# Patient Record
Sex: Male | Born: 1995 | Race: White | Hispanic: Yes | Marital: Single | Smoking: Never smoker
Health system: Southern US, Community
[De-identification: ages and names within clinical notes are randomized; demographics above are authoritative.]

## PROBLEM LIST (undated history)

## (undated) DIAGNOSIS — R519 Headache, unspecified: Secondary | ICD-10-CM

## (undated) DIAGNOSIS — R51 Headache: Secondary | ICD-10-CM

## (undated) HISTORY — DX: Headache, unspecified: R51.9

## (undated) HISTORY — DX: Headache: R51

---
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
## 2016-03-08 ENCOUNTER — Telehealth: Payer: Self-pay | Admitting: Family Medicine

## 2016-03-08 NOTE — Telephone Encounter (Signed)
Pt request refill lisdexamfetamine (VYVANSE) 40 MG capsule °3 mo supply °

## 2016-03-09 NOTE — Telephone Encounter (Signed)
Refills OK. 

## 2016-03-09 NOTE — Telephone Encounter (Signed)
Last refill 11-27-2015 #30 x3 Last OV 09-29-2015 Please advise

## 2016-03-11 MED ORDER — LISDEXAMFETAMINE DIMESYLATE 40 MG PO CAPS
40.0000 mg | ORAL_CAPSULE | ORAL | 0 refills | Status: DC
Start: 2016-03-11 — End: 2016-07-22

## 2016-03-11 MED ORDER — LISDEXAMFETAMINE DIMESYLATE 40 MG PO CAPS: ORAL_CAPSULE | ORAL | 0 refills | Status: DC

## 2016-03-11 NOTE — Telephone Encounter (Signed)
Pts mother is aware via voicemail that scripts are ready for pick up.

## 2016-07-22 ENCOUNTER — Telehealth: Payer: Self-pay | Admitting: Family Medicine

## 2016-07-22 MED ORDER — LISDEXAMFETAMINE DIMESYLATE 40 MG PO CAPS: ORAL_CAPSULE | ORAL | 0 refills | Status: DC

## 2016-07-22 MED ORDER — LISDEXAMFETAMINE DIMESYLATE 40 MG PO CAPS: 40.0000 mg | ORAL_CAPSULE | ORAL | 0 refills | Status: DC

## 2016-07-22 NOTE — Telephone Encounter (Signed)
Last refill 03/11/16. Last office visit 09/29/15.  Okay to fill?

## 2016-07-22 NOTE — Telephone Encounter (Signed)
Pt needs new rx vyvanse 40 mg #30 each for next 3 months

## 2016-07-22 NOTE — Telephone Encounter (Signed)
Mom is aware Rx ready for pick up

## 2016-07-22 NOTE — Telephone Encounter (Signed)
Refills OK. 

## 2016-11-15 DIAGNOSIS — S61412A Laceration without foreign body of left hand, initial encounter: Secondary | ICD-10-CM | POA: Diagnosis not present

## 2016-11-15 DIAGNOSIS — W25XXXA Contact with sharp glass, initial encounter: Secondary | ICD-10-CM | POA: Diagnosis not present

## 2016-12-01 DIAGNOSIS — F4320 Adjustment disorder, unspecified: Secondary | ICD-10-CM | POA: Diagnosis not present

## 2017-01-17 ENCOUNTER — Ambulatory Visit (INDEPENDENT_AMBULATORY_CARE_PROVIDER_SITE_OTHER): Payer: BLUE CROSS/BLUE SHIELD | Admitting: Family Medicine

## 2017-01-17 ENCOUNTER — Encounter: Payer: Self-pay | Admitting: Family Medicine

## 2017-01-17 VITALS — BP 102/78 | HR 100 | Temp 98.5°F | Ht 68.5 in | Wt 178.2 lb

## 2017-01-17 DIAGNOSIS — Z0000 Encounter for general adult medical examination without abnormal findings: Secondary | ICD-10-CM

## 2017-01-17 DIAGNOSIS — Z23 Encounter for immunization: Secondary | ICD-10-CM | POA: Diagnosis not present

## 2017-01-17 MED ORDER — LISDEXAMFETAMINE DIMESYLATE 40 MG PO CAPS: ORAL_CAPSULE | ORAL | 0 refills | Status: DC

## 2017-01-17 MED ORDER — LISDEXAMFETAMINE DIMESYLATE 40 MG PO CAPS: 40.0000 mg | ORAL_CAPSULE | ORAL | 0 refills | Status: DC

## 2017-01-17 NOTE — Patient Instructions (Signed)

## 2017-01-17 NOTE — Progress Notes (Signed)
Subjective:     Patient ID: Jon Lloyd, male   DOB: September 04, 1995, 21 y.o.   MRN: [id number]  HPI Patient seen for physical exam. [facility] [employer]. He has history of attention deficit disorder and takes Vyvanse 40 mg daily. Frequently does not take this on weekends. Seems to be working well. He's had some chronic insomnia for quite some time. Does frequently watch TV at night. No alcohol use. Avoids caffeine. Patient thinks he had tetanus booster within the past couple years. We do not have full vaccine record  Past Medical History:  Diagnosis Date  . Frequent headaches    Past Surgical History:  Procedure Laterality Date  . APPENDECTOMY  2012    reports that he has never smoked. He has never used smokeless tobacco. He reports that he does not drink alcohol or use drugs. family history includes Arthritis in his father. No Known Allergies   Review of Systems  Constitutional: Negative for activity change, appetite change, fatigue and fever.  HENT: Negative for congestion, ear pain and trouble swallowing.   Eyes: Negative for pain and visual disturbance.  Respiratory: Negative for cough, shortness of breath and wheezing.   Cardiovascular: Negative for chest pain and palpitations.  Gastrointestinal: Negative for abdominal distention, abdominal pain, blood in stool, constipation, diarrhea, nausea, rectal pain and vomiting.  Genitourinary: Negative for dysuria, hematuria and testicular pain.  Musculoskeletal: Negative for arthralgias and joint swelling.  Skin: Negative for rash.  Neurological: Negative for dizziness, syncope and headaches.  Hematological: Negative for adenopathy.  Psychiatric/Behavioral: Positive for sleep disturbance. Negative for confusion and dysphoric mood.       Objective:   Physical Exam  Constitutional: He is oriented to person, place, and time. He appears well-developed and well-nourished. No distress.  HENT:  Head:  Normocephalic and atraumatic.  Right Ear: External ear normal.  Left Ear: External ear normal.  Mouth/Throat: Oropharynx is clear and moist.  Eyes: Pupils are equal, round, and reactive to light. Conjunctivae and EOM are normal.  Neck: Normal range of motion. Neck supple. No thyromegaly present.  Cardiovascular: Normal rate, regular rhythm and normal heart sounds.   No murmur heard. Pulmonary/Chest: No respiratory distress. He has no wheezes. He has no rales.  Abdominal: Soft. Bowel sounds are normal. He exhibits no distension and no mass. There is no tenderness. There is no rebound and no guarding.  Musculoskeletal: He exhibits no edema.  Lymphadenopathy:    He has no cervical adenopathy.  Neurological: He is alert and oriented to person, place, and time. He displays normal reflexes. No cranial nerve deficit.  Skin: No rash noted.  Psychiatric: He has a normal mood and affect.       Assessment:     Physical exam. He has some chronic intermittent insomnia    Plan:     -Flu vaccine given. -Confirm date of last tetanus. -He's had previous meningitis vaccine and reportedly also had booster -Nonpharmacologic management of insomnia discussed. Consider over-the-counter melatonin. Avoid bright lights within one hour bedtime. Handout given.  Kristian CoveyBruce W Jannine Abreu MD [facility]

## 2017-02-17 DIAGNOSIS — F4320 Adjustment disorder, unspecified: Secondary | ICD-10-CM | POA: Diagnosis not present

## 2017-04-26 ENCOUNTER — Telehealth: Payer: Self-pay | Admitting: Family Medicine

## 2017-04-26 NOTE — Telephone Encounter (Signed)
Copied from CRM [id number]. Topic: Quick Communication - Rx Refill/Question >> Apr 26, 2017  8:29 AM Louie BunPalacios Medina, Rosey Batheresa D wrote: Has the patient contacted their pharmacy? Yes (Agent: If no, request that the patient contact the pharmacy for the refill.) Preferred Pharmacy (with phone number or street name): [id number] [place], [place] - [address] Agent: Please be advised that RX refills may take up to 3 business days. We ask that you follow-up with your pharmacy. Patient needs refill on his lisdexamfetamine (VYVANSE) 40 MG capsule. Please call patient when its ready for pick-up, thanks.

## 2017-04-26 NOTE — Telephone Encounter (Signed)
Medication needs approval. Chart up to date.Thanks.

## 2017-04-26 NOTE — Telephone Encounter (Signed)
Last refill was given at office visit 01/17/17.  Okay to fill?

## 2017-04-26 NOTE — Telephone Encounter (Signed)
Refills OK. 

## 2017-04-28 ENCOUNTER — Encounter: Payer: Self-pay | Admitting: *Deleted

## 2017-04-28 MED ORDER — LISDEXAMFETAMINE DIMESYLATE 40 MG PO CAPS
40.0000 mg | ORAL_CAPSULE | ORAL | 0 refills | Status: DC
Start: 2017-04-28 — End: 2018-01-10

## 2017-04-28 MED ORDER — LISDEXAMFETAMINE DIMESYLATE 40 MG PO CAPS: ORAL_CAPSULE | ORAL | 0 refills | Status: DC

## 2017-04-28 MED ORDER — LISDEXAMFETAMINE DIMESYLATE 40 MG PO CAPS
ORAL_CAPSULE | ORAL | 0 refills | Status: DC
Start: 2017-04-28 — End: 2018-01-10

## 2017-04-28 NOTE — Telephone Encounter (Signed)
Attempted to call patient but unable to leave a message.  Rx ready for pick up.

## 2017-07-10 NOTE — Telephone Encounter (Signed)
The three scripts dated 04/28/17 that was in the file cabinet was destroyed by SomaliaSylvia and myself 07/10/17.

## 2017-10-16 IMAGING — DX DG HAND COMPLETE 3+V*R*
3 series · 3 of 3 positions shown · non-contrast
Comparison: None.

CLINICAL DATA: Pain following injury 8 days prior

EXAM:
RIGHT HAND - COMPLETE 3+ VIEW

[hand ap]
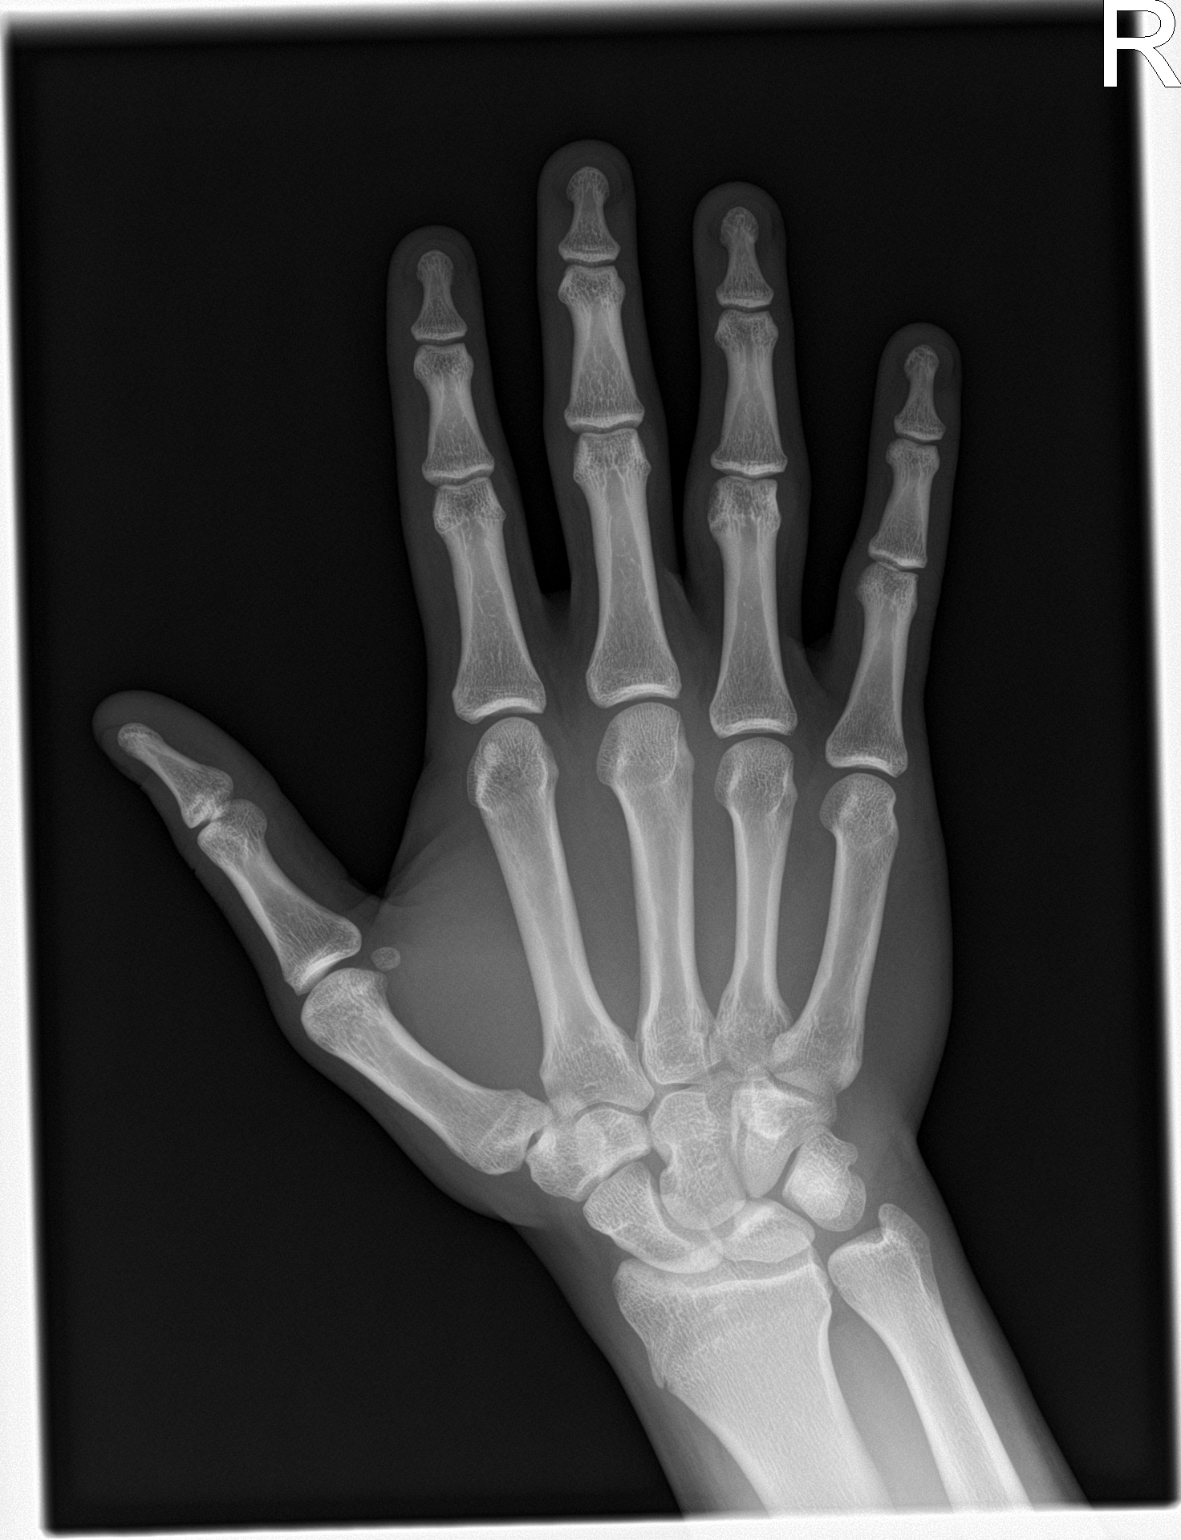

[hand obl]
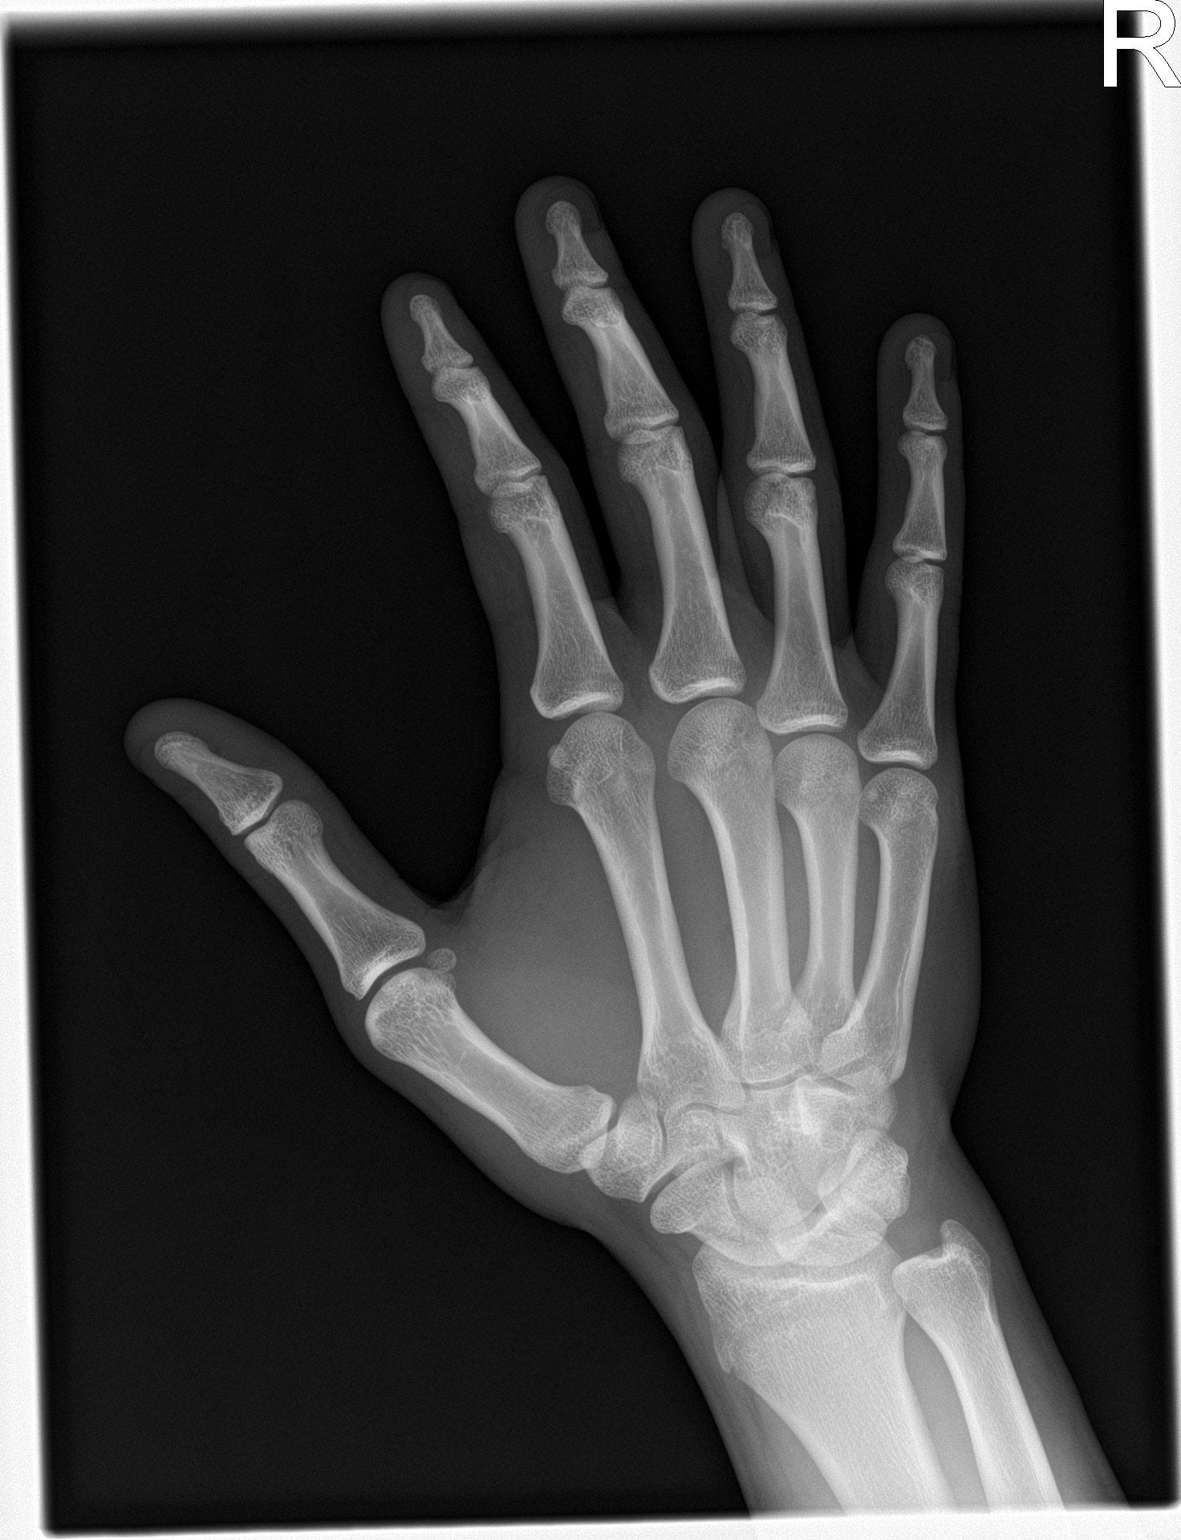

[hand lat]
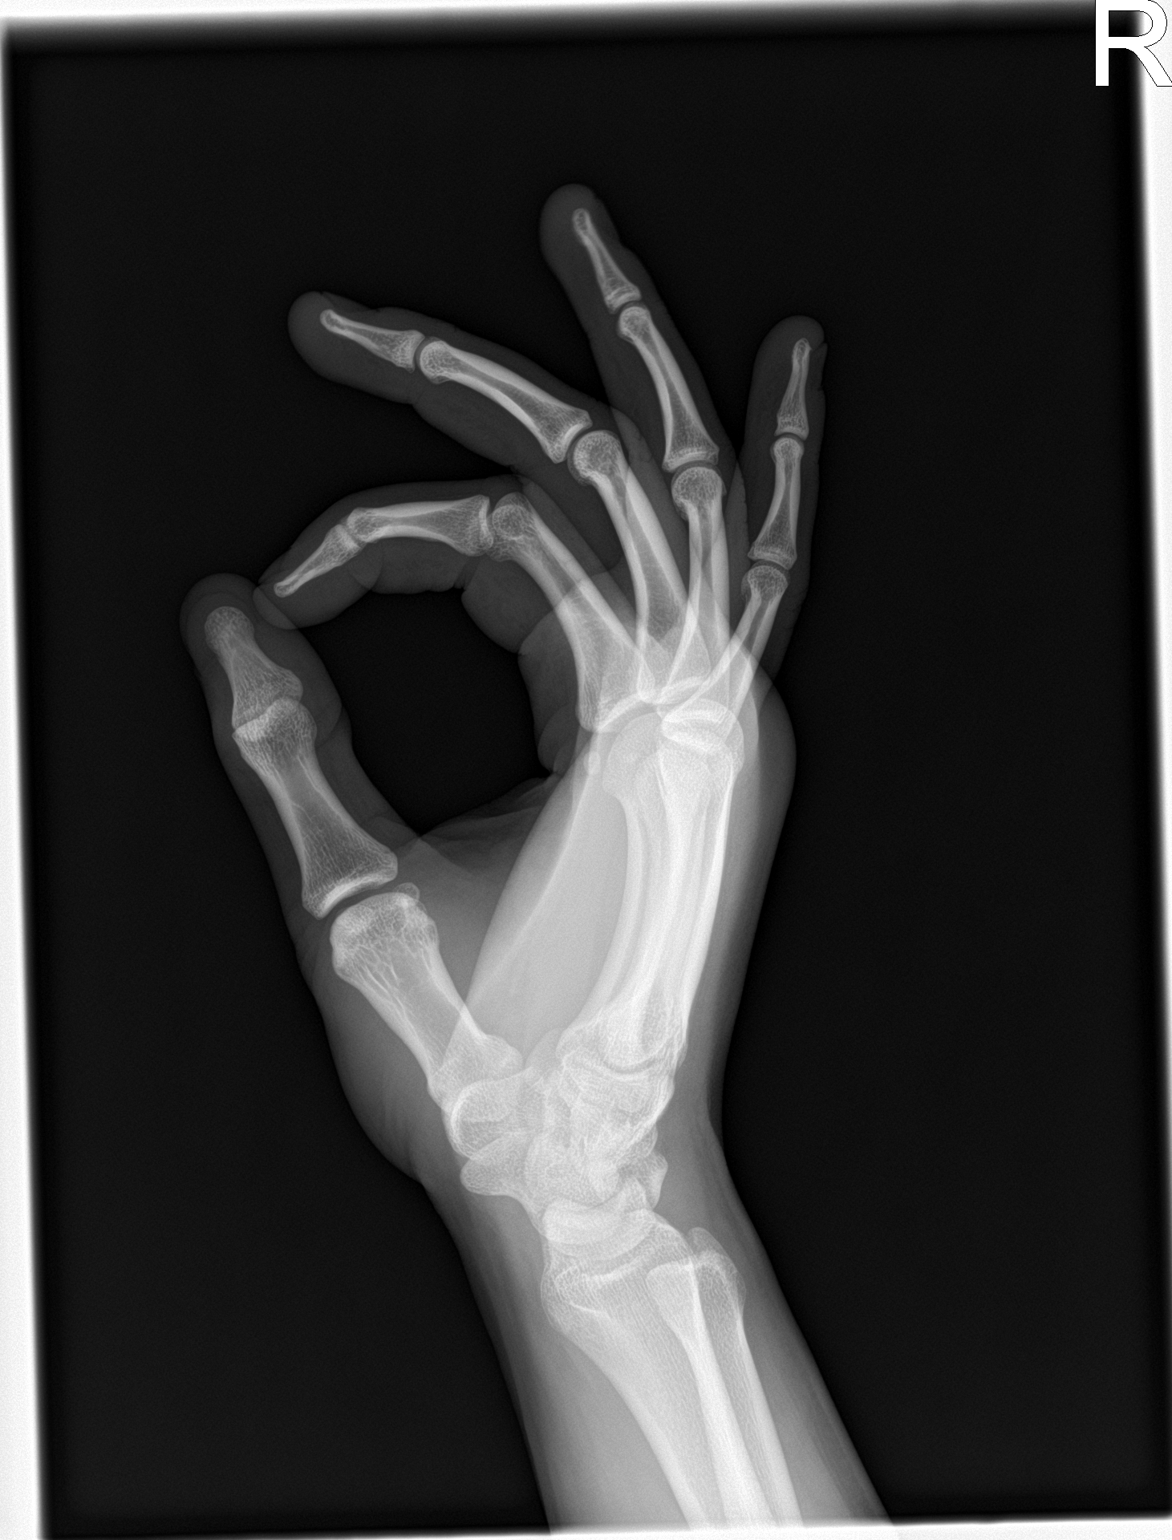

[3 of 3 positions shown; findings below may reference images not displayed]

FINDINGS: Frontal, oblique, and lateral views were obtained. There is no
fracture or dislocation. The joint spaces appear normal. No erosive
change. There is soft tissue swelling dorsally, primarily over the
distal metacarpals.
IMPRESSION: No fracture or dislocation. No appreciable arthropathy. Soft tissue
swelling is noted dorsally.

## 2017-10-30 DIAGNOSIS — J029 Acute pharyngitis, unspecified: Secondary | ICD-10-CM | POA: Diagnosis not present

## 2018-01-10 ENCOUNTER — Encounter: Payer: Self-pay | Admitting: Family Medicine

## 2018-01-10 ENCOUNTER — Ambulatory Visit: Payer: BLUE CROSS/BLUE SHIELD | Admitting: Family Medicine

## 2018-01-10 VITALS — BP 110/80 | HR 74 | Temp 98.4°F | Wt 165.7 lb

## 2018-01-10 DIAGNOSIS — F988 Other specified behavioral and emotional disorders with onset usually occurring in childhood and adolescence: Secondary | ICD-10-CM

## 2018-01-10 MED ORDER — LISDEXAMFETAMINE DIMESYLATE 40 MG PO CAPS
ORAL_CAPSULE | ORAL | 0 refills | Status: DC
Start: 2018-01-10 — End: 2019-04-09

## 2018-01-10 MED ORDER — LISDEXAMFETAMINE DIMESYLATE 40 MG PO CAPS: 40.0000 mg | ORAL_CAPSULE | ORAL | 0 refills | Status: DC

## 2018-01-10 MED ORDER — LISDEXAMFETAMINE DIMESYLATE 40 MG PO CAPS: ORAL_CAPSULE | ORAL | 0 refills | Status: DC

## 2018-01-10 NOTE — Progress Notes (Signed)
  Subjective:     Patient ID: Jon Lloyd, male   DOB: 09/29/1995, 22 y.o.   MRN: [id number]  HPI Patient seen for follow-up regarding attention deficit disorder. He attends [employer]. He takes Vyvanse 40 mgs daily. He has lost about 13 pounds from last year which he attributes to increased exercise and better lifestyle habits. He is eating better. No insomnia. No chest pains. Overall feels good  Past Medical History:  Diagnosis Date  . Frequent headaches    Past Surgical History:  Procedure Laterality Date  . APPENDECTOMY  2012    reports that he has never smoked. He has never used smokeless tobacco. He reports that he does not drink alcohol or use drugs. family history includes Arthritis in his father. No Known Allergies   Review of Systems  Constitutional: Negative for fatigue.  Eyes: Negative for visual disturbance.  Respiratory: Negative for cough, chest tightness and shortness of breath.   Cardiovascular: Negative for chest pain, palpitations and leg swelling.  Neurological: Negative for dizziness, syncope, weakness, light-headedness and headaches.       Objective:   Physical Exam  Constitutional: He is oriented to person, place, and time. He appears well-developed and well-nourished.  HENT:  Right Ear: External ear normal.  Left Ear: External ear normal.  Mouth/Throat: Oropharynx is clear and moist.  Eyes: Pupils are equal, round, and reactive to light.  Neck: Neck supple. No thyromegaly present.  Cardiovascular: Normal rate and regular rhythm.  Pulmonary/Chest: Effort normal and breath sounds normal. No respiratory distress. He has no wheezes. He has no rales.  Musculoskeletal: He exhibits no edema.  Neurological: He is alert and oriented to person, place, and time.       Assessment:     Attention deficit disorder-stable on Vyvanse    Plan:     -refill Vyvanse for 3 months -Follow-up yearly for physical and sooner as needed. We offered flu vaccine  today but he wishes to wait and get later this fall  Kristian Covey MD [facility]

## 2018-11-05 ENCOUNTER — Telehealth: Payer: Self-pay | Admitting: Family Medicine

## 2018-11-05 NOTE — Telephone Encounter (Signed)
Mom, Jon Lloyd calling to request imaging for his injured ankle. Was playing basketball and injured it a month ago but didn't have any medical professional look at it and it's still hurting.

## 2018-11-06 NOTE — Telephone Encounter (Signed)
Pt will need virtual visit 

## 2018-11-07 NOTE — Telephone Encounter (Signed)
Scheduled virtual visit for Monday November 13, 2018

## 2018-11-12 ENCOUNTER — Ambulatory Visit (INDEPENDENT_AMBULATORY_CARE_PROVIDER_SITE_OTHER): Payer: PRIVATE HEALTH INSURANCE | Admitting: Family Medicine

## 2018-11-12 ENCOUNTER — Other Ambulatory Visit: Payer: Self-pay

## 2018-11-12 DIAGNOSIS — M25572 Pain in left ankle and joints of left foot: Secondary | ICD-10-CM | POA: Diagnosis not present

## 2018-11-12 NOTE — Progress Notes (Signed)
Patient ID: Jon Lloyd, male   DOB: 27-Apr-1996, 23 y.o.   MRN: [id number]  This visit type was conducted due to national recommendations for restrictions regarding the COVID-19 pandemic in an effort to limit this patient's exposure and mitigate transmission in our community.   Virtual Visit via Video Note  I connected with Jon GiovanniNathan Mori on 11/12/18 at  1:15 PM EDT by a video enabled telemedicine application and verified that I am speaking with the correct person using two identifiers.  Location patient: home Location provider:work or home office Persons participating in the virtual visit: patient, provider  I discussed the limitations of evaluation and management by telemedicine and the availability of in person appointments. The patient expressed understanding and agreed to proceed.   HPI:  Patient has left ankle pain.  Back in May he was playing some basketball and his body was forced into a metal pole.  He states that he hit with his left lateral ankle as well as knee and even head.  No loss of consciousness.  He states he had some swelling and purplish changes of his left ankle afterwards which lasted for several weeks.  He still has some left lateral ankle pain.  Better after prolonged ambulation.  He used some crutches initially.  He denies any sense of instability of the ankle.  Pain is actually better with movement.  No headaches.  Only occasional knee pain with squatting.  ROS: See pertinent positives and negatives per HPI.  Past Medical History:  Diagnosis Date  . Frequent headaches     Past Surgical History:  Procedure Laterality Date  . APPENDECTOMY  2012    Family History  Problem Relation Age of Onset  . Arthritis Father     SOCIAL HX: Attending [employer] for college.  He should finish either this December or next spring   Current Outpatient Medications:  .  lisdexamfetamine (VYVANSE) 40 MG capsule, Take one capsule daily.  May refill in one month, Disp: 30  capsule, Rfl: 0 .  lisdexamfetamine (VYVANSE) 40 MG capsule, Take 1 capsule (40 mg total) by mouth every morning., Disp: 30 capsule, Rfl: 0 .  lisdexamfetamine (VYVANSE) 40 MG capsule, Take one capsule daily. May refill in two months., Disp: 30 capsule, Rfl: 0  EXAM:  VITALS per patient if applicable:  GENERAL: alert, oriented, appears well and in no acute distress  HEENT: atraumatic, conjunttiva clear, no obvious abnormalities on inspection of external nose and ears  NECK: normal movements of the head and neck  LUNGS: on inspection no signs of respiratory distress, breathing rate appears normal, no obvious gross SOB, gasping or wheezing  CV: no obvious cyanosis  MS: moves all visible extremities without noticeable abnormality  PSYCH/NEURO: pleasant and cooperative, no obvious depression or anxiety, speech and thought processing grossly intact  ASSESSMENT AND PLAN:  Discussed the following assessment and plan:  Acute left ankle pain - Plan: DG Ankle Complete Left patient will come tomorrow for x-ray.  Hopefully, this represents just a contusion but with persistent pain and swelling around the lateral malleolus region he needs further imaging to clarify     I discussed the assessment and treatment plan with the patient. The patient was provided an opportunity to ask questions and all were answered. The patient agreed with the plan and demonstrated an understanding of the instructions.   The patient was advised to call back or seek an in-person evaluation if the symptoms worsen or if the condition fails to improve as anticipated.  Carolann Littler, MD

## 2018-11-13 ENCOUNTER — Other Ambulatory Visit: Payer: Self-pay

## 2018-11-13 ENCOUNTER — Ambulatory Visit (INDEPENDENT_AMBULATORY_CARE_PROVIDER_SITE_OTHER): Payer: PRIVATE HEALTH INSURANCE

## 2018-11-13 DIAGNOSIS — M25572 Pain in left ankle and joints of left foot: Secondary | ICD-10-CM | POA: Diagnosis not present

## 2018-11-16 ENCOUNTER — Telehealth: Payer: Self-pay | Admitting: Family Medicine

## 2018-11-16 ENCOUNTER — Telehealth: Payer: Self-pay | Admitting: *Deleted

## 2018-11-16 NOTE — Telephone Encounter (Signed)
error 

## 2018-11-16 NOTE — Telephone Encounter (Signed)
Called patients mother and gave her the xray results and she stated that Jon Lloyd is still having trouble with his ankle and it is the same and since it happened back in May 2020 she wants to know what to do now? She stated that it is still bothering him and he went back to [place] and needs to know what to do. Please advise.

## 2018-11-16 NOTE — Telephone Encounter (Signed)
If still bothering him recommend follow up with orthopedist or sports medicine specialist.   I don't know anyone personally in that field in [place] area.  If back here anytime soon could set up to see Charlann Boxer.

## 2018-11-16 NOTE — Telephone Encounter (Signed)
Copied from [place] [id number]. Topic: Quick Communication - Other Results (Clinic Use ONLY) >> Nov 16, 2018 10:37 AM Scherrie Gerlach wrote: Mom calling for results of pt's xray. Pls call her or him

## 2018-11-20 ENCOUNTER — Other Ambulatory Visit: Payer: Self-pay

## 2018-11-20 DIAGNOSIS — M25572 Pain in left ankle and joints of left foot: Secondary | ICD-10-CM

## 2018-11-20 NOTE — Telephone Encounter (Signed)
Called patient and LMOVM to return call  [place] for [facility] to Discuss results / PCP / recommendations / Schedule patient  Per Dr. Elease Hashimoto: If still bothering him recommend follow up with orthopedist or sports medicine specialist.   I don't know anyone personally in that field in [place] area.  If back here anytime soon could set up to see Charlann Boxer.  CRM Created.

## 2018-12-13 ENCOUNTER — Encounter: Payer: Self-pay | Admitting: Family Medicine

## 2018-12-13 ENCOUNTER — Ambulatory Visit: Payer: Self-pay

## 2018-12-13 ENCOUNTER — Other Ambulatory Visit: Payer: Self-pay

## 2018-12-13 ENCOUNTER — Ambulatory Visit (INDEPENDENT_AMBULATORY_CARE_PROVIDER_SITE_OTHER): Payer: PRIVATE HEALTH INSURANCE | Admitting: Family Medicine

## 2018-12-13 VITALS — BP 100/60 | HR 93 | Ht 68.0 in | Wt 163.0 lb

## 2018-12-13 DIAGNOSIS — M25572 Pain in left ankle and joints of left foot: Secondary | ICD-10-CM

## 2018-12-13 DIAGNOSIS — G8929 Other chronic pain: Secondary | ICD-10-CM

## 2018-12-13 DIAGNOSIS — S92145A Nondisplaced dome fracture of left talus, initial encounter for closed fracture: Secondary | ICD-10-CM

## 2018-12-13 DIAGNOSIS — S92143A Displaced dome fracture of unspecified talus, initial encounter for closed fracture: Secondary | ICD-10-CM | POA: Insufficient documentation

## 2018-12-13 NOTE — Assessment & Plan Note (Signed)
Extremely small avulsion fracture that is likely contributing to the pain still at this time.  Seems to be healing but not completely.  Once weekly vitamin D, home exercise, icing regimen, we discussed potential bracing.  Discussed avoiding high impact exercises for next 4 weeks.  Patient will follow-up with me in 4 to 5 weeks to make sure healing appropriately.

## 2018-12-13 NOTE — Progress Notes (Signed)
Jon ScaleZach  D.O. [facility] [address] [place], [place] [address] Phone: [phone] Subjective:   I Jon NighKana Lloyd am serving as a Neurosurgeonscribe for Dr. Antoine PrimasZachary .  I'm seeing this patient by the request  of:  Jon Lloyd, Bruce W, MD  CC: left ankle pain   BJY:NWGNFAOZHYHPI:Subjective  Jon Lloyd is a 23 y.o. male coming in with complaint of left ankle pain. States his ankle was slammed against a pole playing basketball. Swollen.   Onset- May  Location - lateral  Duration-  Character- sharp, sore  Aggravating factors- stairs and jumping Reliving factors-  Therapies tried- rest  Severity-4 out of 10 but never seems to be without pain    Past Medical History:  Diagnosis Date  . Frequent headaches    Past Surgical History:  Procedure Laterality Date  . APPENDECTOMY  2012   Social History   Socioeconomic History  . Marital status: Single    Spouse name: Not on file  . Number of children: Not on file  . Years of education: Not on file  . Highest education level: Not on file  Occupational History  . Not on file  Social Needs  . Financial resource strain: Not on file  . Food insecurity    Worry: Not on file    Inability: Not on file  . Transportation needs    Medical: Not on file    Non-medical: Not on file  Tobacco Use  . Smoking status: Never Smoker  . Smokeless tobacco: Never Used  Substance and Sexual Activity  . Alcohol use: No  . Drug use: No  . Sexual activity: Not on file  Lifestyle  . Physical activity    Days per week: Not on file    Minutes per session: Not on file  . Stress: Not on file  Relationships  . Social Musicianconnections    Talks on phone: Not on file    Gets together: Not on file    Attends religious service: Not on file    Active member of club or organization: Not on file    Attends meetings of clubs or organizations: Not on file    Relationship status: Not on file  Other Topics Concern  . Not on file  Social History Narrative  . Not  on file   No Known Allergies Family History  Problem Relation Age of Onset  . Arthritis Father          Current Outpatient Medications (Other):  .  lisdexamfetamine (VYVANSE) 40 MG capsule, Take one capsule daily.  May refill in one month .  lisdexamfetamine (VYVANSE) 40 MG capsule, Take 1 capsule (40 mg total) by mouth every morning. .  lisdexamfetamine (VYVANSE) 40 MG capsule, Take one capsule daily. May refill in two months.    Past medical history, social, surgical and family history all reviewed in electronic medical record.  No pertanent information unless stated regarding to the chief complaint.   Review of Systems:  No headache, visual changes, nausea, vomiting, diarrhea, constipation, dizziness, abdominal pain, skin rash, fevers, chills, night sweats, weight loss, swollen lymph nodes, body aches, joint swelling, muscle aches, chest pain, shortness of breath, mood changes.   Objective  Blood pressure 100/60, pulse 93, height 5\' 8"  (1.727 m), weight 163 lb (73.9 kg), SpO2 98 %.    General: No apparent distress alert and oriented x3 mood and affect normal, dressed appropriately.  HEENT: Pupils equal, extraocular movements intact  Respiratory: Patient's speak in full sentences  and does not appear short of breath  Cardiovascular: No lower extremity edema, non tender, no erythema  Skin: Warm dry intact with no signs of infection or rash on extremities or on axial skeleton.  Abdomen: Soft nontender  Neuro: Cranial nerves II through XII are intact, neurovascularly intact in all extremities with 2+ DTRs and 2+ pulses.  Lymph: No lymphadenopathy of posterior or anterior cervical chain or axillae bilaterally.  Gait normal with good balance and coordination.  MSK:  Non tender with full range of motion and good stability and symmetric strength and tone of shoulders, elbows, wrist, hip, knee bilaterally.  Left ankle exam shows the patient does have some mild swelling over the  lateral malleolus area.  Patient otherwise fairly unremarkable full strength good stability, negative anterior drawer test.  Mild tenderness to palpation more anterior than posterior laterally. Contralateral ankle unremarkable  Limited musculoskeletal ultrasound was performed and interpreted by Lyndal Pulley  Limited ultrasound of patient's left ankle shows what appears to be a potential small avulsion fracture with callus formation noted over the talar dome on the anterior lateral aspect.  Mild hypoechoic changes and increasing Doppler flow in the area.  No masses appreciated.  Patient's lateral malleolus seems to be intact.  ATFL as noted.  [id number]; 15 additional minutes spent for Therapeutic exercises as stated in above notes.  This included exercises focusing on stretching, strengthening, with significant focus on eccentric aspects.   Long term goals include an improvement in range of motion, strength, endurance as well as avoiding reinjury. Patient's frequency would include in 1-2 times a day, 3-5 times a week for a duration of 6-12 weeks. Ankle strengthening that included:  Basic range of motion exercises to allow proper full motion at ankle Stretching of the lower leg and hamstrings  Theraband exercises for the lower leg - inversion, eversion, dorsiflexion and plantarflexion each to be completed with a theraband Balance exercises to increase proprioception Weight bearing exercises to increase strength and balance  Proper technique shown and discussed handout in great detail with ATC.  All questions were discussed and answered.       Impression and Recommendations:     This case required medical decision making of moderate complexity. The above documentation has been reviewed and is accurate and complete Lyndal Pulley, DO       Note: This dictation was prepared with Dragon dictation along with smaller phrase technology. Any transcriptional errors that result from this process are  unintentional.

## 2018-12-13 NOTE — Patient Instructions (Signed)
No basketball until labor day Exercise 3 times a week See me again in 5-6 weeks

## 2019-01-24 ENCOUNTER — Ambulatory Visit: Payer: PRIVATE HEALTH INSURANCE | Admitting: Family Medicine

## 2019-04-09 ENCOUNTER — Ambulatory Visit (INDEPENDENT_AMBULATORY_CARE_PROVIDER_SITE_OTHER): Payer: PRIVATE HEALTH INSURANCE | Admitting: Family Medicine

## 2019-04-09 ENCOUNTER — Other Ambulatory Visit: Payer: Self-pay

## 2019-04-09 DIAGNOSIS — F988 Other specified behavioral and emotional disorders with onset usually occurring in childhood and adolescence: Secondary | ICD-10-CM | POA: Diagnosis not present

## 2019-04-09 MED ORDER — LISDEXAMFETAMINE DIMESYLATE 40 MG PO CAPS: ORAL_CAPSULE | ORAL | 0 refills | Status: DC

## 2019-04-09 MED ORDER — LISDEXAMFETAMINE DIMESYLATE 40 MG PO CAPS: 40.0000 mg | ORAL_CAPSULE | ORAL | 0 refills | Status: DC

## 2019-04-09 NOTE — Progress Notes (Signed)
Patient ID: Jon Lloyd, male   DOB: [birth year], 23 y.o.   MRN: [id number]  This visit type was conducted due to national recommendations for restrictions regarding the COVID-19 pandemic in an effort to limit this patient's exposure and mitigate transmission in our community.   Virtual Visit via Video Note  I connected with Jon Lloyd on 04/09/19 at 11:15 AM EST by a video enabled telemedicine application and verified that I am speaking with the correct person using two identifiers.  Location patient: home Location provider:work or home office Persons participating in the virtual visit: patient, provider  I discussed the limitations of evaluation and management by telemedicine and the availability of in person appointments. The patient expressed understanding and agreed to proceed.   HPI: Follow-up regarding attention deficit disorder.  He will be finishing up his studies at [employer] this spring.  He is currently doing all online classes will be back in person for the spring.  He takes Vyvanse 40 mg daily and that seems to be working well for him.  No recent headaches.  He is some chronic insomnia which is unchanged.  No chest pains.  He had ankle pain with evulsion fracture of the talus back in the summer and that has fully healed.  He is back to running and full exercise.   ROS: See pertinent positives and negatives per HPI.  Past Medical History:  Diagnosis Date  . Frequent headaches     Past Surgical History:  Procedure Laterality Date  . APPENDECTOMY  2012    Family History  Problem Relation Age of Onset  . Arthritis Father     SOCIAL HX: Attends [employer], non-smoker   Current Outpatient Medications:  .  lisdexamfetamine (VYVANSE) 40 MG capsule, Take one capsule daily. May refill in two months., Disp: 30 capsule, Rfl: 0 .  lisdexamfetamine (VYVANSE) 40 MG capsule, Take one capsule daily.  May refill in one month, Disp: 30 capsule, Rfl: 0 .   lisdexamfetamine (VYVANSE) 40 MG capsule, Take 1 capsule (40 mg total) by mouth every morning., Disp: 30 capsule, Rfl: 0  EXAM:  VITALS per patient if applicable:  GENERAL: alert, oriented, appears well and in no acute distress  HEENT: atraumatic, conjunttiva clear, no obvious abnormalities on inspection of external nose and ears  NECK: normal movements of the head and neck  LUNGS: on inspection no signs of respiratory distress, breathing rate appears normal, no obvious gross SOB, gasping or wheezing  CV: no obvious cyanosis  MS: moves all visible extremities without noticeable abnormality  PSYCH/NEURO: pleasant and cooperative, no obvious depression or anxiety, speech and thought processing grossly intact  ASSESSMENT AND PLAN:  Discussed the following assessment and plan:  Attention deficit disorder stable  -Refill Vyvanse for 3 months     I discussed the assessment and treatment plan with the patient. The patient was provided an opportunity to ask questions and all were answered. The patient agreed with the plan and demonstrated an understanding of the instructions.   The patient was advised to call back or seek an in-person evaluation if the symptoms worsen or if the condition fails to improve as anticipated.    Jon Littler, MD

## 2019-06-04 ENCOUNTER — Telehealth: Payer: Self-pay | Admitting: Family Medicine

## 2019-06-04 NOTE — Telephone Encounter (Signed)
Jon Lloyd the patients mother mailed Patient Assistance forms to have completed  Call the patient to pick up forms at:  [name] [phone] and/or [name] [phone]  Disposition: Dr's Folder

## 2019-06-05 NOTE — Telephone Encounter (Signed)
done

## 2019-06-05 NOTE — Telephone Encounter (Signed)
Forms placed in red folder on desk. Please return once complete. Thank you.

## 2019-06-05 NOTE — Telephone Encounter (Signed)
Called patient and LMOVM to return call on Heather Fikes's number  Ok for [facility] to Discuss results / PCP / recommendations / Schedule patient  I have placed the completed paperwork up front and the paperwork is ready for pick up. I have also made a copy.

## 2019-06-10 NOTE — Telephone Encounter (Signed)
Pts father Rosanne Ashing) came and picked up the form and there was "NO CHARGE"

## 2019-11-19 ENCOUNTER — Other Ambulatory Visit: Payer: Self-pay

## 2019-11-19 ENCOUNTER — Other Ambulatory Visit (HOSPITAL_COMMUNITY)
Admission: RE | Admit: 2019-11-19 | Discharge: 2019-11-19 | Disposition: A | Discharge status: Home / Self Care | Payer: 59 | Source: Ambulatory Visit | Attending: Family Medicine | Admitting: Family Medicine

## 2019-11-19 ENCOUNTER — Encounter: Payer: Self-pay | Admitting: Family Medicine

## 2019-11-19 ENCOUNTER — Ambulatory Visit: Payer: 59 | Admitting: Family Medicine

## 2019-11-19 VITALS — BP 116/64 | HR 95 | Temp 98.4°F | Wt 171.6 lb

## 2019-11-19 DIAGNOSIS — Z113 Encounter for screening for infections with a predominantly sexual mode of transmission: Secondary | ICD-10-CM | POA: Diagnosis present

## 2019-11-19 DIAGNOSIS — B081 Molluscum contagiosum: Secondary | ICD-10-CM | POA: Diagnosis not present

## 2019-11-19 MED ORDER — PODOFILOX 0.5 % EX SOLN: CUTANEOUS | 1 refills | Status: AC

## 2019-11-19 NOTE — Addendum Note (Signed)
Addended by: Kristian Covey on: 11/19/2019 05:32 PM   Modules accepted: Orders

## 2019-11-19 NOTE — Patient Instructions (Signed)
Molluscum Contagiosum, Adult Molluscum contagiosum is a skin infection that can cause a rash. Your rash may go away on its own, or you may need to have a procedure or use medicine to treat the rash. What are the causes? This condition is caused by a virus. The virus can spread from person to person (is contagious). It can spread through:  Skin-to-skin contact with an infected person.  Contact with an object that has the virus on it (contaminated object), such as a towel or clothing.  Sexual activity. What increases the risk? You may be more likely to develop this condition if you:  Live in an area where the weather is moist and warm.  Have a weak disease-fighting system (immune system). What are the signs or symptoms? The main symptom of this condition is a painless rash that appears 2-7 weeks after exposure to the virus. The rash is made up of small, dome-shaped bumps on the skin. The bumps may:  Affect the genitals, thighs, face, neck, or abdomen.  Be pink or flesh-colored.  Appear one by one or in groups.  Range from the size of a pinhead to the size of a pencil eraser.  Feel firm, smooth, and waxy.  Have a pit in the middle.  Itch. For most people, the rash does not itch. How is this diagnosed? This condition may be diagnosed based on:  Your symptoms and medical history.  A physical exam.  Scraping the bumps to collect a skin sample for testing. How is this treated? The rash usually goes away within 2 months, but it can sometimes take 6-12 months for it to clear completely. For some people, the rash may go away on its own, without treatment. In some cases, treatment may be needed to keep the virus from infecting other people or to keep the rash from spreading to other parts of your body. Treatment may also be done if you have anxiety or stress because of the way the rash looks. If you do need treatment, the options may include:  Surgery to remove the bumps by freezing  them (cryosurgery).  A procedure to scrape off the bumps (curettage).  A procedure to remove the bumps with a laser.  Putting medicine on the bumps (topical treatment). Follow these instructions at home: General instructions  Take or apply over-the-counter and prescription medicines only as told by your health care provider.  Do not scratch or pick at the bumps. Scratching or picking can cause the rash to spread to other parts of your body. Preventing infection As long as you have bumps on your skin, the infection can spread to other people. To prevent this from happening:  Do not share clothing or towels with others until the bumps go away.  Do not use a public swimming pool, sauna, or shower until the bumps go away.  Avoid close contact with others until the bumps go away. This includes sexual contact.  Wash your hands often with soap and water. If soap and water are not available, use hand sanitizer.  Cover the bumps with clothing or a bandage when you will be near other people. Contact a health care provider if:  The bumps are spreading.  The bumps are becoming red and sore.  The bumps have not gone away after 12 months. Summary  Molluscum contagiosum is a skin infection that can cause a rash made up of small, dome-shaped bumps.  The infection is caused by a virus.  The rash usually goes away   within 2 months, but it can sometimes take 6-12 months for it to clear completely.  The rash often goes away on its own. However, treatment is sometimes recommended to keep the virus from infecting other people or to keep it from spreading to other parts of your body. This information is not intended to replace advice given to you by your health care provider. Make sure you discuss any questions you have with your health care provider. Document Revised: 08/17/2018 Document Reviewed: 05/08/2017 Elsevier Patient Education  2020 Elsevier Inc.  

## 2019-11-19 NOTE — Progress Notes (Addendum)
Established Patient Office Visit  Subjective:  Patient ID: Jon Lloyd, male    DOB: 06-14-1995  Age: 24 y.o. MRN: [id number]  CC:  Chief Complaint  Patient presents with   Rash    on genitals for a week and half to 2 weeks no burning , itching  only redness    HPI Domonique Brouillard presents for skin rash mostly suprapubic area.  He first noticed this about 2 weeks ago.  He did have intercourse with a new her partner few weeks ago.  He denies any urinary symptoms.  No other rashes.  No history of reported STD.  No other systemic symptoms.  Past Medical History:  Diagnosis Date   Frequent headaches     Past Surgical History:  Procedure Laterality Date   APPENDECTOMY  2012    Family History  Problem Relation Age of Onset   Arthritis Father     Social History   Socioeconomic History   Marital status: Single    Spouse name: Not on file   Number of children: Not on file   Years of education: Not on file   Highest education level: Not on file  Occupational History   Not on file  Tobacco Use   Smoking status: Never Smoker   Smokeless tobacco: Never Used  Substance and Sexual Activity   Alcohol use: No   Drug use: No   Sexual activity: Not on file  Other Topics Concern   Not on file  Social History Narrative   Not on file   Social Determinants of Health   Financial Resource Strain:    Difficulty of Paying Living Expenses:   Food Insecurity:    Worried About Programme researcher, broadcasting/film/video in the Last Year:    Barista in the Last Year:   Transportation Needs:    Freight forwarder (Medical):    Lack of Transportation (Non-Medical):   Physical Activity:    Days of Exercise per Week:    Minutes of Exercise per Session:   Stress:    Feeling of Stress :   Social Connections:    Frequency of Communication with Friends and Family:    Frequency of Social Gatherings with Friends and Family:    Attends Religious Services:    Active  Member of Clubs or Organizations:    Attends Engineer, structural:    Marital Status:   Intimate Partner Violence:    Fear of Current or Ex-Partner:    Emotionally Abused:    Physically Abused:    Sexually Abused:     Outpatient Medications Prior to Visit  Medication Sig Dispense Refill   lisdexamfetamine (VYVANSE) 40 MG capsule Take one capsule daily. May refill in two months. 30 capsule 0   lisdexamfetamine (VYVANSE) 40 MG capsule Take one capsule daily.  May refill in one month 30 capsule 0   lisdexamfetamine (VYVANSE) 40 MG capsule Take 1 capsule (40 mg total) by mouth every morning. 30 capsule 0   No facility-administered medications prior to visit.    No Known Allergies  ROS Review of Systems  Constitutional: Negative for chills and fever.  Genitourinary: Negative for dysuria.  Skin: Positive for rash.      Objective:    Physical Exam Vitals reviewed.  Constitutional:      Appearance: Normal appearance.  Cardiovascular:     Rate and Rhythm: Normal rate and regular rhythm.  Skin:    Findings: Rash present.     Comments:  He has several small approximately 1-2 mm papular lesions with umbilicated center typical for classic molluscum contagiosum and these are clustered in the suprapubic region.  Neurological:     Mental Status: He is alert.     BP 116/64 (BP Location: Left Arm, Patient Position: Sitting, Cuff Size: Normal)    Pulse 95    Temp 98.4 F (36.9 C) (Oral)    Wt 171 lb 9.6 oz (77.8 kg)    SpO2 99%    BMI 26.09 kg/m  Wt Readings from Last 3 Encounters:  11/19/19 171 lb 9.6 oz (77.8 kg)  12/13/18 163 lb (73.9 kg)  01/10/18 165 lb 11.2 oz (75.2 kg)     Health Maintenance Due  Topic Date Due   Hepatitis C Screening  Never done   HIV Screening  Never done   TETANUS/TDAP  Never done    There are no preventive care reminders to display for this patient.  No results found for: TSH Lab Results  Component Value Date   WBC 7.1  02/17/2008   HGB 13.9 02/17/2008   HCT 41.2 02/17/2008   MCV 88.9 02/17/2008   PLT 198 02/17/2008   Lab Results  Component Value Date   NA 136 02/17/2008   K 3.9 02/17/2008   CO2 24 02/17/2008   GLUCOSE 84 02/17/2008   BUN 14 02/17/2008   CREATININE 0.53 02/17/2008   BILITOT 0.6 02/17/2008   ALKPHOS 210 02/17/2008   AST 34 02/17/2008   ALT 17 02/17/2008   PROT 7.4 02/17/2008   ALBUMIN 4.2 02/17/2008   CALCIUM 9.1 02/17/2008   No results found for: CHOL No results found for: HDL No results found for: LDLCALC No results found for: TRIG No results found for: CHOLHDL No results found for: ESPQ3R    Assessment & Plan:   Molluscum contagiosum, adult.  We explained that this is frequently considered an STD and can be self resolving but can sometimes take several months to resolve.  -We discussed various options for treatment including curettage, cryotherapy, or topical therapy.  He would like to consider the latter.  We discussed possible cantharidin therapy but did not have any in stock at this time.  We will look at ordering this and getting him back for follow-up soon for treatment if we can get this acquired -He is aware this can be contagious by skin to skin contact -Recommend other STD screening with HIV, RPR, urine screen cytology for chlamydia and GC  -We also discussed topical therapy with Condylox 0.5% solution.  After doing some further research we realized that cantharidin is not available in the [place].  We will try the Condylox if he can get this filled with insurance to use twice daily 3 days/week up to 4 cycles.  No orders of the defined types were placed in this encounter.   Follow-up: No follow-ups on file.    Evelena Peat, MD

## 2019-11-20 LAB — RPR: RPR Ser Ql: NONREACTIVE

## 2019-11-20 LAB — HIV ANTIBODY (ROUTINE TESTING W REFLEX): HIV 1&2 Ab, 4th Generation: NONREACTIVE

## 2019-11-21 LAB — URINE CYTOLOGY ANCILLARY ONLY
Chlamydia: NEGATIVE
Comment: NEGATIVE
Comment: NORMAL
Neisseria Gonorrhea: NEGATIVE

## 2019-12-06 ENCOUNTER — Telehealth: Payer: Self-pay | Admitting: Family Medicine

## 2019-12-06 NOTE — Telephone Encounter (Signed)
Tried calling patient, unable to leave message, mailbox not set up. 

## 2019-12-06 NOTE — Telephone Encounter (Signed)
Pt is calling in reference to the Rx podofilox (CONDYLOX) 0.5% some of the side effects it has.  Pt would like to have a call back.

## 2019-12-23 ENCOUNTER — Telehealth: Payer: Self-pay | Admitting: Family Medicine

## 2019-12-23 MED ORDER — LISDEXAMFETAMINE DIMESYLATE 40 MG PO CAPS: 40.0000 mg | ORAL_CAPSULE | ORAL | 0 refills | Status: AC

## 2019-12-23 MED ORDER — LISDEXAMFETAMINE DIMESYLATE 40 MG PO CAPS: ORAL_CAPSULE | ORAL | 0 refills | Status: AC

## 2019-12-23 NOTE — Telephone Encounter (Signed)
Last  OV was 11/19/2019

## 2019-12-23 NOTE — Telephone Encounter (Signed)
Pts mother is calling in stating that the pt need a refill on lisdexamfetamine (VYVANSE) 40 MG   Pharm:  [facility] on [address]

## 2020-12-11 IMAGING — DX LEFT ANKLE COMPLETE - 3+ VIEW
3 series · 3 of 3 positions shown · non-contrast
Comparison: None.

CLINICAL DATA: Pain following injury 2 months prior

EXAM:
LEFT ANKLE COMPLETE - 3+ VIEW

[ankle ap]
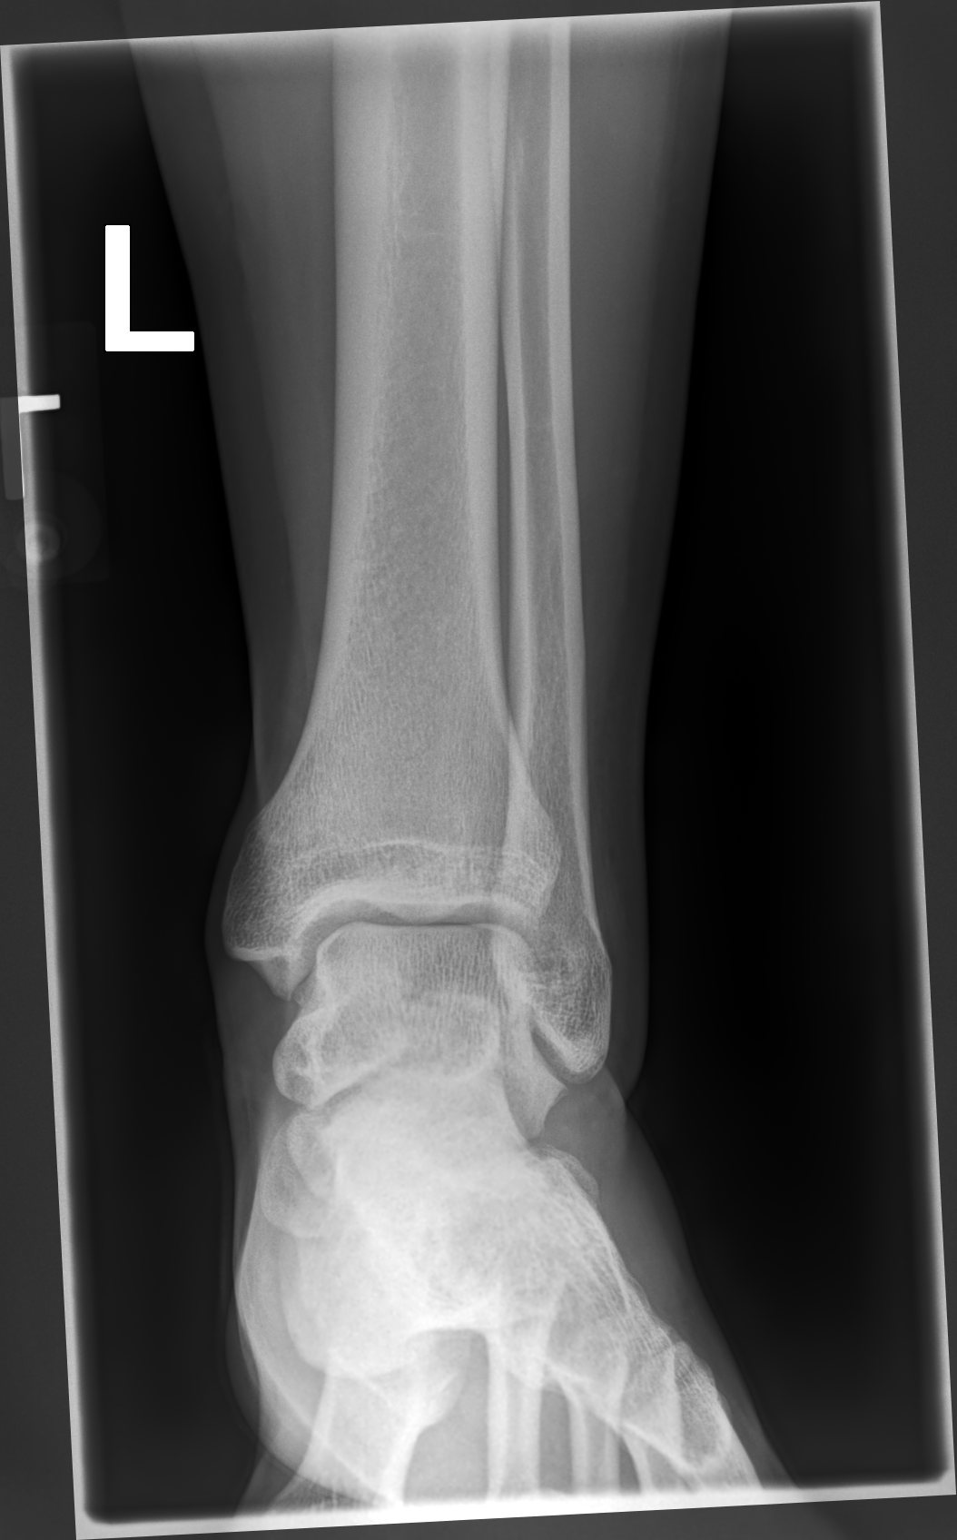

[ankle mlo]
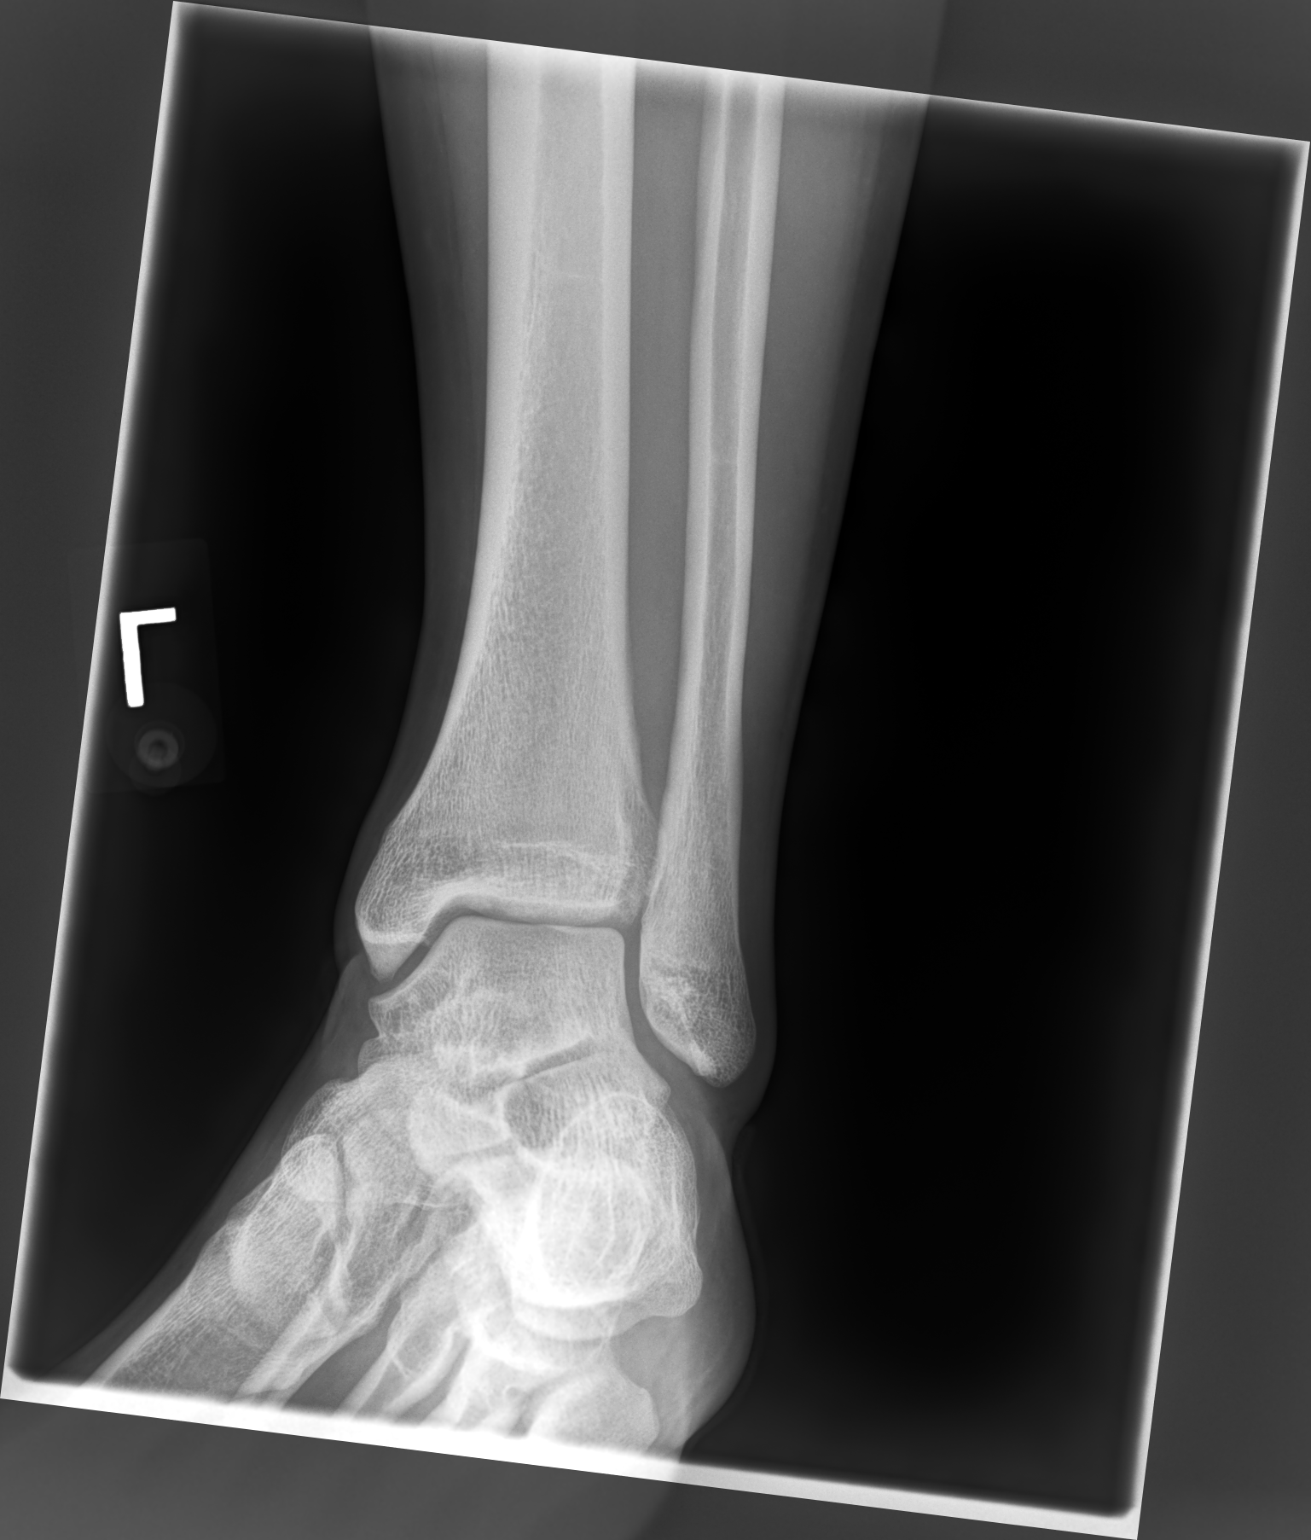

[ankle lat]
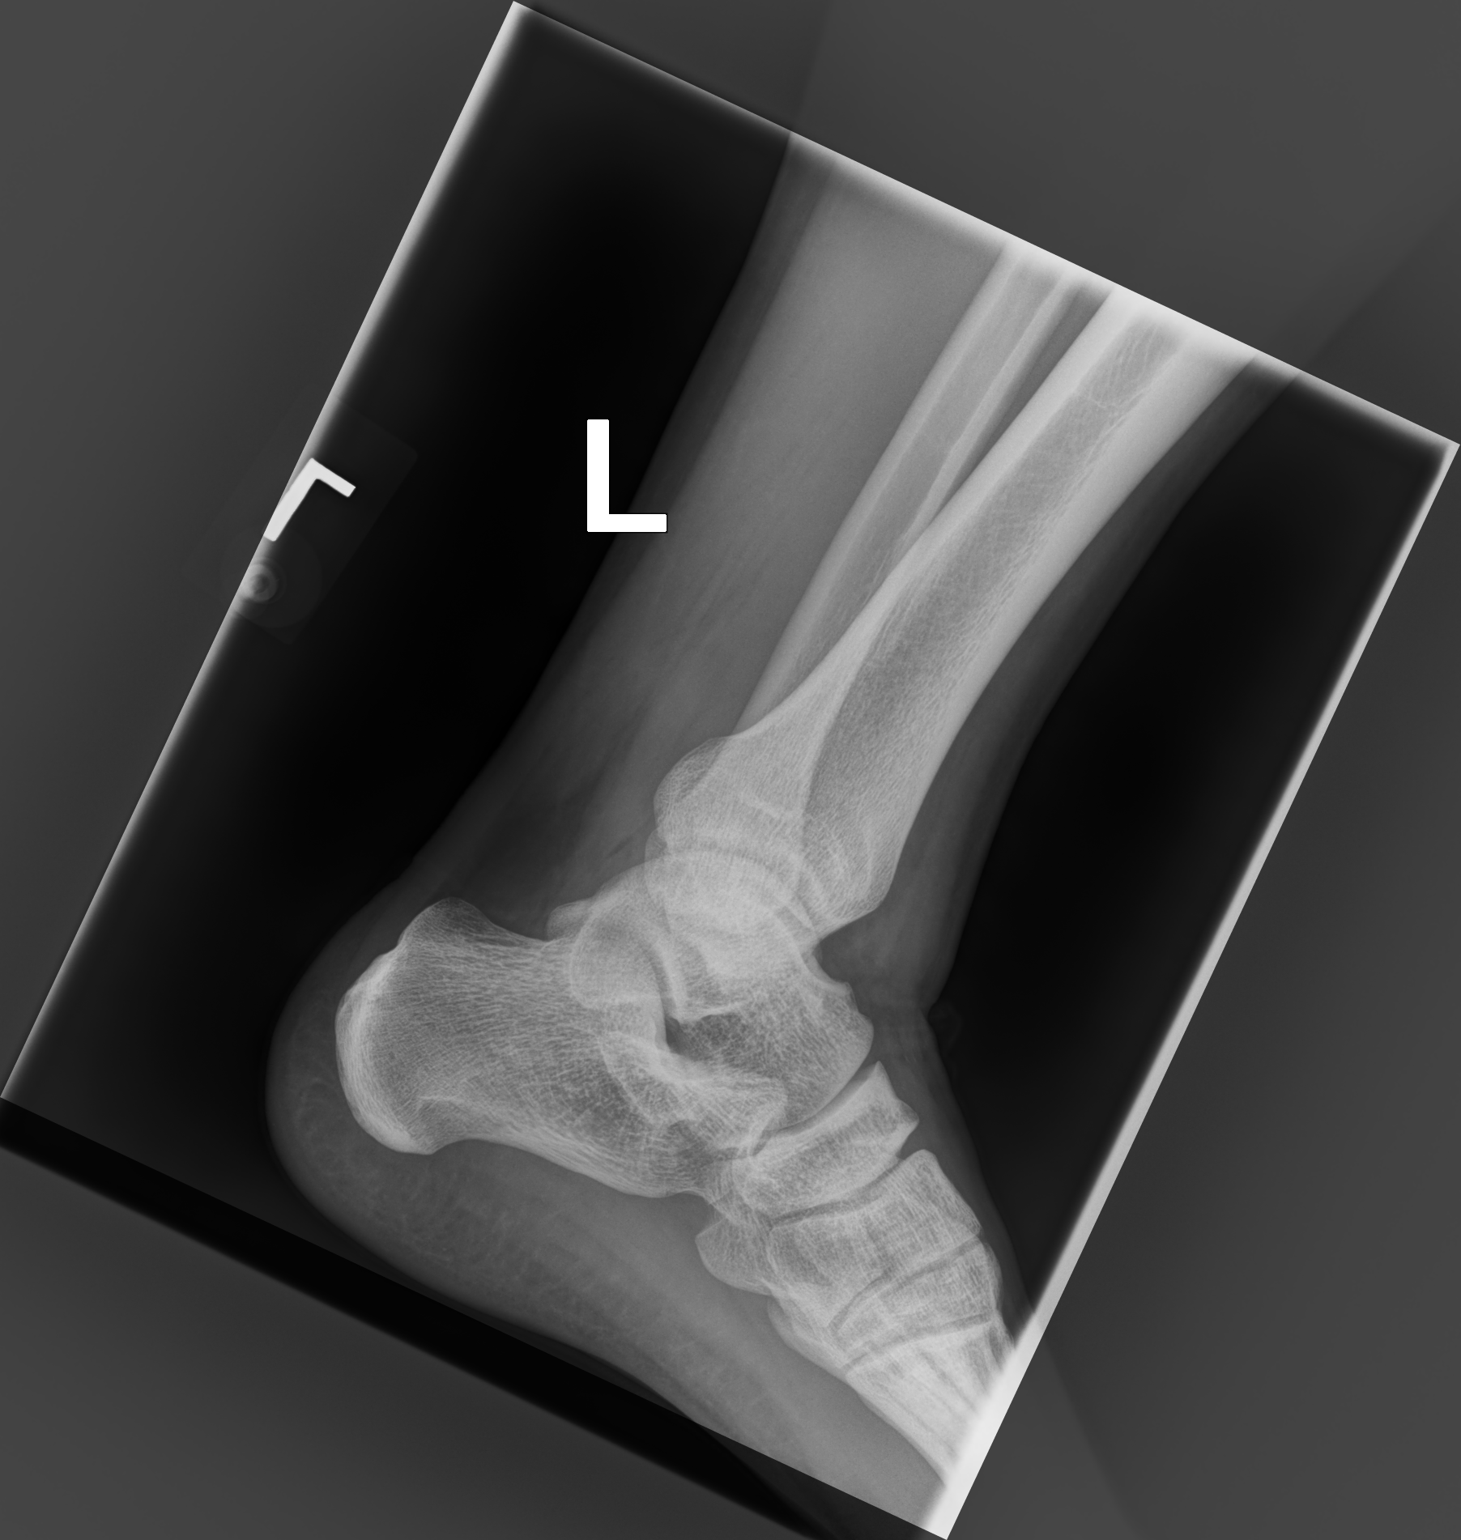

[3 of 3 positions shown; findings below may reference images not displayed]

FINDINGS: Frontal, oblique, and lateral views were obtained. There is no
fracture or joint effusion. There is no joint space narrowing or
erosion. Ankle mortise appears intact.
IMPRESSION: No evident fracture or arthropathy.  Ankle mortise appears intact.

## 2021-11-12 ENCOUNTER — Ambulatory Visit: Payer: Commercial Managed Care - HMO | Admitting: Family Medicine

## 2023-01-13 ENCOUNTER — Ambulatory Visit: Payer: Commercial Managed Care - HMO | Admitting: Podiatry

## 2023-01-27 ENCOUNTER — Encounter: Payer: Self-pay | Admitting: Podiatry

## 2023-01-27 ENCOUNTER — Ambulatory Visit (INDEPENDENT_AMBULATORY_CARE_PROVIDER_SITE_OTHER): Payer: Commercial Managed Care - HMO | Admitting: Podiatry

## 2023-01-27 DIAGNOSIS — M7661 Achilles tendinitis, right leg: Secondary | ICD-10-CM | POA: Diagnosis not present

## 2023-01-27 DIAGNOSIS — E739 Lactose intolerance, unspecified: Secondary | ICD-10-CM | POA: Insufficient documentation

## 2023-01-27 NOTE — Progress Notes (Signed)
Subjective:  Patient ID: Jon Lloyd, male    DOB: Oct 09, 1995,  MRN: [id number]  Chief Complaint  Patient presents with   Foot Pain    Right foot pain with exercise pain is located in the back of the heel     27 y.o. male presents with the above complaint.  Patient presents with complaint of right Achilles insertional pain.  Patient stated may have injured it back in April 1 however it is getting worse now.  He is not able to do any jumping or running activities without feeling pain in the back of the heel.  He has not tried anything for his tried ankle brace.  Pain scale 7 out of 10 dull ache in nature   Review of Systems: Negative except as noted in the HPI. Denies N/V/F/Ch.  Past Medical History:  Diagnosis Date   Frequent headaches     Current Outpatient Medications:    lisdexamfetamine (VYVANSE) 40 MG capsule, Take one capsule daily.  May refill in one month, Disp: 30 capsule, Rfl: 0   lisdexamfetamine (VYVANSE) 40 MG capsule, Take 1 capsule (40 mg total) by mouth every morning., Disp: 30 capsule, Rfl: 0   lisdexamfetamine (VYVANSE) 40 MG capsule, Take one capsule daily. May refill in two months., Disp: 30 capsule, Rfl: 0   podofilox (CONDYLOX) 0.5 % external solution, Apply to affected lesions twice daily 3 days/week as needed, Disp: 3.5 mL, Rfl: 1  Social History   Tobacco Use  Smoking Status Never  Smokeless Tobacco Never    No Known Allergies Objective:  There were no vitals filed for this visit. There is no height or weight on file to calculate BMI. Constitutional Well developed. Well nourished.  Vascular Dorsalis pedis pulses palpable bilaterally. Posterior tibial pulses palpable bilaterally. Capillary refill normal to all digits.  No cyanosis or clubbing noted. Pedal hair growth normal.  Neurologic Normal speech. Oriented to person, place, and time. Epicritic sensation to light touch grossly present bilaterally.  Dermatologic Nails well groomed and normal  in appearance. No open wounds. No skin lesions.  Orthopedic: Pain on palpation of right Achilles tendon insertion pain with dorsiflexion of the ankle joint no pain with plantarflexion of the ankle joint no pain at the peroneal tendon posterior tibial tendon ATFL ligament.  Mild Haglund's deformity noted.  Mild posterior spurring clinically appreciated   Radiographs: None Assessment:   1. Right Achilles tendinitis    Plan:  Patient was evaluated and treated and all questions answered.  Right Achilles tendinitis  -All questions and concerns were discussed with the patient in extensive detail given the amount of pain that he is having he will benefit from cam boot immobilization to allow the soft tissue structure to heal.  I discussed with patient he states understand like to proceed with cam boot immobilization -Cam boot was dispensed   No follow-ups on file.

## 2023-02-24 ENCOUNTER — Ambulatory Visit: Payer: Managed Care, Other (non HMO) | Admitting: Podiatry

## 2023-03-03 ENCOUNTER — Ambulatory Visit (INDEPENDENT_AMBULATORY_CARE_PROVIDER_SITE_OTHER): Payer: Managed Care, Other (non HMO) | Admitting: Podiatry

## 2023-03-03 ENCOUNTER — Encounter: Payer: Self-pay | Admitting: Podiatry

## 2023-03-03 DIAGNOSIS — M7661 Achilles tendinitis, right leg: Secondary | ICD-10-CM | POA: Diagnosis not present

## 2023-03-03 DIAGNOSIS — M216X2 Other acquired deformities of left foot: Secondary | ICD-10-CM

## 2023-03-03 DIAGNOSIS — Z79899 Other long term (current) drug therapy: Secondary | ICD-10-CM

## 2023-03-03 DIAGNOSIS — M216X1 Other acquired deformities of right foot: Secondary | ICD-10-CM | POA: Diagnosis not present

## 2023-03-03 DIAGNOSIS — M21961 Unspecified acquired deformity of right lower leg: Secondary | ICD-10-CM

## 2023-03-03 NOTE — Progress Notes (Signed)
  Subjective:  Patient ID: Jon Lloyd, male    DOB: [birth year],  MRN: [id number]  Chief Complaint  Patient presents with   Foot Pain    RM# 7 follow up on foot pain the boot was affective also stretching exercises no pain no concerns at this time.     27 y.o. male presents with the above complaint.  Patient presents with follow-up to right Achilles tendinitis he states doing a lot better cam boot immobilization helped he does not have any pain.  He would like to discuss prevention technique.   Review of Systems: Negative except as noted in the HPI. Denies N/V/F/Ch.  Past Medical History:  Diagnosis Date   Frequent headaches     Current Outpatient Medications:    lisdexamfetamine (VYVANSE) 40 MG capsule, Take one capsule daily.  May refill in one month, Disp: 30 capsule, Rfl: 0   lisdexamfetamine (VYVANSE) 40 MG capsule, Take 1 capsule (40 mg total) by mouth every morning., Disp: 30 capsule, Rfl: 0   lisdexamfetamine (VYVANSE) 40 MG capsule, Take one capsule daily. May refill in two months., Disp: 30 capsule, Rfl: 0   podofilox (CONDYLOX) 0.5 % external solution, Apply to affected lesions twice daily 3 days/week as needed, Disp: 3.5 mL, Rfl: 1  Social History   Tobacco Use  Smoking Status Never  Smokeless Tobacco Never    No Known Allergies Objective:  There were no vitals filed for this visit. There is no height or weight on file to calculate BMI. Constitutional Well developed. Well nourished.  Vascular Dorsalis pedis pulses palpable bilaterally. Posterior tibial pulses palpable bilaterally. Capillary refill normal to all digits.  No cyanosis or clubbing noted. Pedal hair growth normal.  Neurologic Normal speech. Oriented to person, place, and time. Epicritic sensation to light touch grossly present bilaterally.  Dermatologic Nails well groomed and normal in appearance. No open wounds. No skin lesions.  Orthopedic: No further pain on palpation of right Achilles  tendon insertion pain with dorsiflexion of the ankle joint no pain with plantarflexion of the ankle joint no pain at the peroneal tendon posterior tibial tendon ATFL ligament.  Mild Haglund's deformity noted.  Mild posterior spurring clinically appreciated   Radiographs: None Assessment:   1. Deformity of both feet   2. Right Achilles tendinitis    Plan:  Patient was evaluated and treated and all questions answered.  Right Achilles tendinitis  -Clinically healed with cam boot immobilization.  Therefore we will hold we will on further injections.  At this time I discussed shoe gear modification and orthotics management I discussed with the patient extensive detail he states understand like to proceed with orthotics  Foot deformity bilateral -I explained to patient the etiology of pes planovalgus and relationship with Planter fasciitis and various treatment options were discussed.  Given patient foot structure in the setting of Planter fasciitis I believe patient will benefit from custom-made orthotics to help control the hindfoot motion support the arch of the foot and take the stress away from plantar fascial.  Patient agrees with the plan like to proceed with orthotics -Patient was casted for orthotics with quarter inch heel lift   No follow-ups on file.

## 2023-04-05 ENCOUNTER — Encounter: Payer: Self-pay | Admitting: Family Medicine

## 2023-04-05 ENCOUNTER — Ambulatory Visit (INDEPENDENT_AMBULATORY_CARE_PROVIDER_SITE_OTHER): Payer: Commercial Managed Care - HMO | Admitting: Family Medicine

## 2023-04-05 VITALS — BP 132/80 | HR 75 | Temp 97.6°F | Ht 68.0 in | Wt 181.4 lb

## 2023-04-05 DIAGNOSIS — R21 Rash and other nonspecific skin eruption: Secondary | ICD-10-CM

## 2023-04-05 DIAGNOSIS — L299 Pruritus, unspecified: Secondary | ICD-10-CM

## 2023-04-05 MED ORDER — PERMETHRIN 5 % EX CREA: 1.0000 | TOPICAL_CREAM | Freq: Once | CUTANEOUS | 1 refills | Status: AC

## 2023-04-05 NOTE — Patient Instructions (Signed)
Apply the Elimite as instructed and wash off after 8-14 hours  May repeat in 14 days as needed.

## 2023-04-05 NOTE — Progress Notes (Signed)
   Established Patient Office Visit  Subjective   Patient ID: Jon Lloyd, male    DOB: [birth year]  Age: 27 y.o. MRN: [id number]  Chief Complaint  Patient presents with   Rash    Patient complains of rash on flank, thighs and legs, x2 months    HPI   Jon Lloyd is seen with pruritic rash on his mostly lower extremities but also buttock and for the past couple months.  He states his girlfriend has a very similar distribution of rash.  He had severe pruritus especially at night.  No relief with over-the-counter medications.  Has some excoriations on both feet.  He did query possible scabies after reading up on this.  No history of scabies previously.  No scalp involvement.  Past Medical History:  Diagnosis Date   Frequent headaches    Past Surgical History:  Procedure Laterality Date   APPENDECTOMY  2012    reports that he has never smoked. He has never used smokeless tobacco. He reports that he does not drink alcohol and does not use drugs. family history includes Arthritis in his father. No Known Allergies  Review of Systems  Constitutional:  Negative for chills and fever.  Skin:  Positive for rash.      Objective:     BP 132/80 (BP Location: Left Arm, Patient Position: Sitting, Cuff Size: Normal)   Pulse 75   Temp 97.6 F (36.4 C) (Oral)   Ht 5\' 8"  (1.727 m)   Wt 181 lb 6.4 oz (82.3 kg)   SpO2 97%   BMI 27.58 kg/m    Physical Exam Vitals reviewed.  Constitutional:      General: He is not in acute distress.    Appearance: He is not ill-appearing.  Skin:    Findings: Rash present.     Comments: He has some scattered excoriations on buttock left greater than right and also dorsum of both feet.  No pustules.  No vesicles.  Neurological:     Mental Status: He is alert.      No results found for any visits on 04/05/23.    The ASCVD Risk score (Arnett DK, et al., 2019) failed to calculate for the following reasons:   The 2019 ASCVD risk score is only valid  for ages 12 to 26    Assessment & Plan:   55-month history of pruritic rash involving the lower extremities and buttock predominantly.  His girlfriend has very similar rash.  Question possible scabies.  -Recommend trial of Elimite 5% cream apply neck down to the feet and leave on for at least 8 to 14 hours then rinse off thoroughly.  May repeat in 14 days as needed.  Also advised to place his bed sheets and clothing that he sleeps and in the washer immediately the next morning after treatment -Follow-up for any persistent rash  Evelena Peat, MD

## 2023-05-04 ENCOUNTER — Other Ambulatory Visit: Payer: Managed Care, Other (non HMO)

## 2023-05-30 ENCOUNTER — Encounter: Payer: Self-pay | Admitting: Family Medicine

## 2023-05-30 MED ORDER — PERMETHRIN 5 % EX CREA: TOPICAL_CREAM | Freq: Once | CUTANEOUS | 0 refills | Status: AC
